# Patient Record
Sex: Male | Born: 1955 | Race: Black or African American | Hispanic: No | Marital: Single | Smoking: Current every day smoker
Health system: Southern US, Community
[De-identification: ages and names within clinical notes are randomized; demographics above are authoritative.]

## PROBLEM LIST (undated history)

## (undated) DIAGNOSIS — M1990 Unspecified osteoarthritis, unspecified site: Secondary | ICD-10-CM

## (undated) DIAGNOSIS — G8929 Other chronic pain: Principal | ICD-10-CM

## (undated) DIAGNOSIS — M545 Low back pain: Principal | ICD-10-CM

## (undated) DIAGNOSIS — T07XXXA Unspecified multiple injuries, initial encounter: Secondary | ICD-10-CM

## (undated) HISTORY — DX: Unspecified osteoarthritis, unspecified site: M19.90

## (undated) HISTORY — DX: Other chronic pain: G89.29

## (undated) HISTORY — DX: Low back pain: M54.5

## (undated) HISTORY — DX: Unspecified multiple injuries, initial encounter: T07.XXXA

---

## 1988-05-28 HISTORY — PX: OTHER SURGICAL HISTORY: SHX169

## 1994-05-28 DIAGNOSIS — M545 Low back pain, unspecified: Secondary | ICD-10-CM

## 1994-05-28 HISTORY — DX: Low back pain, unspecified: M54.50

## 1994-05-28 HISTORY — PX: LEG SURGERY: SHX1003

## 2007-11-02 ENCOUNTER — Emergency Department (HOSPITAL_COMMUNITY): Admission: EM | Admit: 2007-11-02 | Discharge: 2007-11-03 | Payer: Self-pay | Admitting: Emergency Medicine

## 2009-06-17 ENCOUNTER — Ambulatory Visit: Payer: Self-pay | Admitting: Diagnostic Radiology

## 2010-01-06 ENCOUNTER — Emergency Department (HOSPITAL_BASED_OUTPATIENT_CLINIC_OR_DEPARTMENT_OTHER): Admission: EM | Admit: 2010-01-06 | Discharge: 2010-01-06 | Payer: Self-pay | Admitting: Emergency Medicine

## 2010-04-09 ENCOUNTER — Emergency Department (HOSPITAL_COMMUNITY): Admission: EM | Admit: 2010-04-09 | Discharge: 2010-04-10 | Payer: Self-pay | Admitting: Pediatrics

## 2010-04-20 ENCOUNTER — Inpatient Hospital Stay (HOSPITAL_COMMUNITY)
Admission: EM | Admit: 2010-04-20 | Discharge: 2010-04-22 | Payer: Self-pay | Source: Home / Self Care | Admitting: Emergency Medicine

## 2010-04-21 ENCOUNTER — Ambulatory Visit: Payer: Self-pay | Admitting: Gastroenterology

## 2010-04-21 ENCOUNTER — Encounter (INDEPENDENT_AMBULATORY_CARE_PROVIDER_SITE_OTHER): Payer: Self-pay | Admitting: *Deleted

## 2010-04-22 ENCOUNTER — Encounter (INDEPENDENT_AMBULATORY_CARE_PROVIDER_SITE_OTHER): Payer: Self-pay | Admitting: *Deleted

## 2010-04-22 ENCOUNTER — Encounter (INDEPENDENT_AMBULATORY_CARE_PROVIDER_SITE_OTHER): Payer: Self-pay | Admitting: Internal Medicine

## 2010-04-23 ENCOUNTER — Encounter (INDEPENDENT_AMBULATORY_CARE_PROVIDER_SITE_OTHER): Payer: Self-pay | Admitting: *Deleted

## 2010-04-24 ENCOUNTER — Encounter (INDEPENDENT_AMBULATORY_CARE_PROVIDER_SITE_OTHER): Payer: Self-pay | Admitting: *Deleted

## 2010-05-04 ENCOUNTER — Emergency Department (HOSPITAL_BASED_OUTPATIENT_CLINIC_OR_DEPARTMENT_OTHER): Admission: EM | Admit: 2010-05-04 | Discharge: 2009-06-17 | Payer: Self-pay | Admitting: Emergency Medicine

## 2010-05-10 ENCOUNTER — Telehealth (INDEPENDENT_AMBULATORY_CARE_PROVIDER_SITE_OTHER): Payer: Self-pay | Admitting: *Deleted

## 2010-05-11 ENCOUNTER — Telehealth (INDEPENDENT_AMBULATORY_CARE_PROVIDER_SITE_OTHER): Payer: Self-pay | Admitting: *Deleted

## 2010-05-19 ENCOUNTER — Ambulatory Visit: Payer: Self-pay | Admitting: Gastroenterology

## 2010-06-01 ENCOUNTER — Telehealth (INDEPENDENT_AMBULATORY_CARE_PROVIDER_SITE_OTHER): Payer: Self-pay | Admitting: *Deleted

## 2010-06-01 ENCOUNTER — Encounter (INDEPENDENT_AMBULATORY_CARE_PROVIDER_SITE_OTHER): Payer: Self-pay | Admitting: *Deleted

## 2010-06-16 ENCOUNTER — Ambulatory Visit: Admit: 2010-06-16 | Payer: Self-pay | Admitting: Gastroenterology

## 2010-06-20 ENCOUNTER — Emergency Department (HOSPITAL_COMMUNITY)
Admission: EM | Admit: 2010-06-20 | Discharge: 2010-06-21 | Payer: Self-pay | Source: Home / Self Care | Admitting: Emergency Medicine

## 2010-06-21 LAB — DIFFERENTIAL
Lymphocytes Relative: 27 % (ref 12–46)
Lymphs Abs: 1.4 10*3/uL (ref 0.7–4.0)
Monocytes Relative: 5 % (ref 3–12)
Neutro Abs: 3.4 10*3/uL (ref 1.7–7.7)
Neutrophils Relative %: 66 % (ref 43–77)

## 2010-06-21 LAB — CBC
HCT: 31.1 % — ABNORMAL LOW (ref 39.0–52.0)
Hemoglobin: 10 g/dL — ABNORMAL LOW (ref 13.0–17.0)
MCH: 23 pg — ABNORMAL LOW (ref 26.0–34.0)
MCV: 71.7 fL — ABNORMAL LOW (ref 78.0–100.0)
RBC: 4.34 MIL/uL (ref 4.22–5.81)

## 2010-06-21 LAB — COMPREHENSIVE METABOLIC PANEL
Albumin: 3.8 g/dL (ref 3.5–5.2)
Alkaline Phosphatase: 62 U/L (ref 39–117)
BUN: 7 mg/dL (ref 6–23)
CO2: 25 mEq/L (ref 19–32)
Chloride: 111 mEq/L (ref 96–112)
Potassium: 4.1 mEq/L (ref 3.5–5.1)
Total Bilirubin: 0.4 mg/dL (ref 0.3–1.2)

## 2010-06-21 LAB — ETHANOL: Alcohol, Ethyl (B): 377 mg/dL — ABNORMAL HIGH (ref 0–10)

## 2010-06-22 ENCOUNTER — Emergency Department (HOSPITAL_COMMUNITY)
Admission: EM | Admit: 2010-06-22 | Discharge: 2010-06-23 | Payer: Self-pay | Source: Home / Self Care | Admitting: Emergency Medicine

## 2010-06-22 LAB — DIFFERENTIAL
Basophils Absolute: 0.1 10*3/uL (ref 0.0–0.1)
Eosinophils Absolute: 0.2 10*3/uL (ref 0.0–0.7)
Lymphocytes Relative: 22 % (ref 12–46)
Neutrophils Relative %: 69 % (ref 43–77)

## 2010-06-22 LAB — COMPREHENSIVE METABOLIC PANEL
Albumin: 3.6 g/dL (ref 3.5–5.2)
BUN: 5 mg/dL — ABNORMAL LOW (ref 6–23)
CO2: 25 mEq/L (ref 19–32)
Chloride: 109 mEq/L (ref 96–112)
Creatinine, Ser: 0.87 mg/dL (ref 0.4–1.5)
GFR calc non Af Amer: >60 mL/min (ref >60)
Total Bilirubin: 0.8 mg/dL (ref 0.3–1.2)

## 2010-06-22 LAB — ETHANOL: Alcohol, Ethyl (B): 276 mg/dL — ABNORMAL HIGH (ref 0–10)

## 2010-06-22 LAB — CBC
MCH: 22.7 pg — ABNORMAL LOW (ref 26.0–34.0)
MCHC: 31.9 g/dL (ref 30.0–36.0)
WBC: 5.8 10*3/uL (ref 4.0–10.5)

## 2010-06-27 NOTE — Letter (Signed)
Summary: New Patient letter  [facility]  [address] [place], [place] [address]   Phone: [phone]  Fax: [phone]       04/24/2010 MRN: [id number]  John Reid [address] [place], [place]  [address]  Dear John Reid,  Welcome to the Gastroenterology Division at [facility].    You are scheduled to see Dr.  Christella Reid on 05-19-10 at 3:00p.m. on the 3rd floor at [facility], [address]  We ask that you try to arrive at our office 15 minutes prior to your appointment time to allow for check-in.  We would like you to complete the enclosed self-administered evaluation form prior to your visit and bring it with you on the day of your appointment.  We will review it with you.  Also, please bring a complete list of all your medications or, if you prefer, bring the medication bottles and we will list them.  Please bring your insurance card so that we may make a copy of it.  If your insurance requires a referral to see a specialist, please bring your referral form from your primary care physician.  Co-payments are due at the time of your visit and may be paid by cash, check or credit card.     Your office visit will consist of a consult with your physician (includes a physical exam), any laboratory testing he/she may order, scheduling of any necessary diagnostic testing (e.g. x-ray, ultrasound, CT-scan), and scheduling of a procedure (e.g. Endoscopy, Colonoscopy) if required.  Please allow enough time on your schedule to allow for any/all of these possibilities.    If you cannot keep your appointment, please call [phone] to cancel or reschedule prior to your appointment date.  This allows [place] the opportunity to schedule an appointment for another patient in need of care.  If you do not cancel or reschedule by 5 p.m. the business day prior to your appointment date, you will be charged a $50.00 late cancellation/no-show fee.    Thank you for choosing  [facility] for your medical needs.  We appreciate the opportunity to care for you.  Please visit [place] at our website  to learn more about our practice.                     Sincerely,                                                             The Gastroenterology Division

## 2010-06-29 NOTE — Letter (Signed)
Summary: New Patient letter  [facility]  [address] [place], [place] [address]   Phone: [phone]  Fax: [phone]       06/01/2010 MRN: [id number]  John Reid [address] [place], [place]  [address]  Dear Mr. RHUE,  Welcome to the Gastroenterology Division at [facility].    You are scheduled to see Dr. Christella Hartigan  on 06/16/10  at 2:45 pm on the 3rd floor at [facility], [address].  We ask that you try to arrive at our office 15 minutes prior to your appointment time to allow for check-in.  We would like you to complete the enclosed self-administered evaluation form prior to your visit and bring it with you on the day of your appointment.  We will review it with you.  Also, please bring a complete list of all your medications or, if you prefer, bring the medication bottles and we will list them.  Please bring your insurance card so that we may make a copy of it.  If your insurance requires a referral to see a specialist, please bring your referral form from your primary care physician.  Co-payments are due at the time of your visit and may be paid by cash, check or credit card.     Your office visit will consist of a consult with your physician (includes a physical exam), any laboratory testing he/she may order, scheduling of any necessary diagnostic testing (e.g. x-ray, ultrasound, CT-scan), and scheduling of a procedure (e.g. Endoscopy, Colonoscopy) if required.  Please allow enough time on your schedule to allow for any/all of these possibilities.    If you cannot keep your appointment, please call [phone] to cancel or reschedule prior to your appointment date.  This allows [place] the opportunity to schedule an appointment for another patient in need of care.  If you do not cancel or reschedule by 5 p.m. the business day prior to your appointment date, you will be charged a $50.00 late cancellation/no-show fee.    Thank you for choosing  [facility] for your medical needs.  We appreciate the opportunity to care for you.  Please visit [place] at our website  to learn more about our practice.                     Sincerely,                                                             The Gastroenterology Division

## 2010-06-29 NOTE — Progress Notes (Signed)
Summary: cx appt  Phone Note Outgoing Call   Call placed by: Chales Abrahams CMA Duncan Dull),  May 11, 2010 9:17 AM Summary of Call: A phone number for the pt was found [phone].  I advised him that his appt would be cx because he has a ecent history with Dr Loreta Ave.  I also advised that he could have his records sent to Dr Christella Hartigan for review if he wishes to transfer care.  Pt verbalized understanding Initial call taken by: Chales Abrahams CMA Duncan Dull),  May 11, 2010 9:20 AM

## 2010-06-29 NOTE — Op Note (Signed)
Summary: Upper Endoscopy  [facility] [address] [place], [place] [address] OPERATIVE PROCEDURE REPORT PATIENT: John Reid, John Reid MR#: [id number] BIRTHDATE: 1956/04/21 GENDER: male ENDOSCOPIST: Lorenza Burton, MD ASSISTANT: Ricki Miller, technician and Altamese Dilling, RN. PROCEDURE DATE: 04/22/2010 PRE-PROCEDURE PREPERATION: Patient fasted for 8 hours prior to the procedure. PRE-PROCEDURE PHYSICAL: Patient has stable vital signs. Neck is supple. There is no JVD, thyromegaly or LAD. Chest clear to auscultation. S1 and S2 regular. Abdomen soft, non-distended, non-tender with NABS. PROCEDURE: EGD with multiple cold biopsies. ASA CLASS: Class II INDICATIONS: 1) Iron deficiency anemia 2) Rectal bleeding 3) Alcoholism. MEDICATIONS: Fentanyl 75 mcg, Benadryl 25 mg & Versed 5 mg IV. TOPICAL ANESTHETIC: Viscus xylocaine-10 cc PO. DESCRIPTION OF PROCEDURE: After the risks benefits and alternatives of the procedure were thoroughly explained, informed consent was obtained. The EG-[id number] ([id number]) and EC-3890Li ([id number]) endoscope was introduced through the mouth and advanced to the second portion of the duodenum, without limitations. The instrument was slowly withdrawn as the mucosa was fully examined. The esophagus and GEJ were widely patent with a small patch of salmon colored mucosa above the Z-line that was biopsied to rule out Barrett's. There were no varices or evidence of esophagitis. The scope was then advanced into the stomach. The patient had large gastric folds with nodular changes in the gastric mucosa in the midbody of the stomach-multiple cold biopsies were done. The proximal small bowel appeared normal. There were no ulcers, erosions, masses or polyps noted. Retroflexed views revealed a small hiatal hernia. The scope was then withdrawn from the patient and the procedure terminated. The patient tolerated the procedure without immediate complications. Moses Rexene Edison [facility] [address] [place], [place] [address] IMPRESSION: 1) Small patch of ?Barrett's mucosa biopsied above the Z-line. 2) Hypertrophic gasrtric folds-biopsied. 3) Small hiatal hernia. 4) Normal proximal small bowel. RECOMMENDATIONS: 1) Anti-reflux regimen to be followed. 2) Await pathology results. 3) Avoid NSAIDS for now. 4) Continue PPI'S. 5) Proceed with a colonoscopy. REPEAT EXAM: None planned for now. DISCHARGE INSTRUCTIONS: Standard instructions given. _______________________________ Lorenza Burton, MD CPT CODES: 64403 DIAGNOSIS CODES: 280.9, 569.3 CC: Rob Bunting, M.D. n. eSIGNED: Lorenza Burton at 04/22/2010 10:33 AM Page 2

## 2010-06-29 NOTE — Op Note (Signed)
Summary: Colonoscopy  [facility] [address] [place], [place] [address] OPERATIVE PROCEDURE REPORT PATIENT: John Reid, John Reid MR#: [id number] BIRTHDATE: 01-21-1956 GENDER: male ENDOSCOPIST: Lorenza Burton, MD ASSISTANT: Ricki Miller, technician and Altamese Dilling, RN. PROCEDURE DATE: 04/22/2010 PRE-PROCEDURE PREPERATION: The patient was prepped with 2 dulcolax tablets, one ten-ounce bottle of magnesium citrate, and a gallon of Golytley the night prior to the procedure. The patient was fasted for 8 hours prior to the procedure. PRE-PROCEDURE PHYSICAL: Patient has stable vital signs. Neck is supple. There is no JVD, thyromegaly or LAD. Chest clear to auscultation. S1 and S2 regular. Abdomen soft, non-distended, non-tender with NABS. PROCEDURE: Colonoscopy with cold biopsies x 2. ASA CLASS: Class II INDICATIONS: 1) Colorectal cancer screening 2) Iron deficiency anemia 3) Rectal bleeding. MEDICATIONS: Fentanyl 25 mcg & Versed 5 mg IV. DESCRIPTION OF PROCEDURE: After the risks, benefits, and alternatives of the procedure were thoroughly explained [including a 10% missed rate of cancer and polyps], informed consent was obtained. Digital rectal exam was performed. The EC-3890Li ([id number]) colonoscope was introduced through the anus and advanced to the terminal ileum which was intubated for a short distance, without limitations. The quality of the prep was adequate. Multiple washes were done. Small lesions could be missed. The instrument was then slowly withdrawn as the colon was fully examined. FINDINGS: There was one small sessile cecal polyp that was removed by cold biopsies x 2. The rest of the colonic mucosa appeared healthy with a normal vascular pattern. No masses, diverticula or AVM's were noted. The appendiceal orifice and the ICV were identified and photographed. The terminal ileum appeared normal. Retroflexed views revealed prominent internal hemorrhoids. The  patient tolerated the procedure without immediate complications. The scope was then withdrawn from the patient and the procedure terminated. [name], [name] IMPRESSION: 1) Prominent internal hemorrhoids. 2) Small sessile cecal polyp removed by cold biopsies x 2. 3) Otherwise normal colonoscopy up to the terminal ileum. RECOMMENDATIONS: 1) High fiber diet with liberal fluid intake. 2) Await pathology results. 3) Avoid all NSAIDS for now. 4) Serials CBC's. 5) Iron supplementation as needed. REPEAT EXAM: In 5-10 years, depending on the pathology results; in case the patient has any abnormal GI symptoms in the interim, he should contact the office immediately for further recommendations. DISCHARGE INSTRUCTIONS: Standard discharge instructions given. _______________________________ Lorenza Burton, MD CPT CODES: 25366 DIAGNOSIS CODES: 211.3, 569.3, 280.9, V76.51 CC: Rob Bunting, M.D. n. eSIGNED: Lorenza Burton at 04/22/2010 10:47 AM Page 2

## 2010-06-29 NOTE — Progress Notes (Signed)
Summary: Appt question  Phone Note Other Incoming   Caller: Lupita Leash Summary of Call: DrJacobs this pt was on your schedule and was cx because of a history with Dr Loreta Ave.  Lupita Leash from Dr Kenna Gilbert office called yesterday and advised me that Dr Loreta Ave only saw the pt in the hospital unassigned call and you discharged the pt and should be your pt.  Can I put him back on your schedule? Initial call taken by: Chales Abrahams CMA Duncan Dull),  June 01, 2010 8:26 AM  Follow-up for Phone Call        yes, I just spoke with Dr. Loreta Ave actually.  She was only covering Leb in hospital.  He can stay on my schedule.  Also, he needs prev-pac called in because of H. pylori + biospies of stomach by Dr. Loreta Ave. Follow-up by: Rachael Fee MD,  June 01, 2010 8:29 AM  Additional Follow-up for Phone Call Additional follow up Details #1::        pt aware and rx was sent in. NP3 was scheduled Additional Follow-up by: Chales Abrahams CMA Duncan Dull),  June 01, 2010 8:38 AM    New/Updated Medications: PREVPAC  MISC (AMOXICILL-CLARITHRO-LANSOPRAZ) as directed Prescriptions: PREVPAC  MISC (AMOXICILL-CLARITHRO-LANSOPRAZ) as directed  #1 x 0   Entered by:   Chales Abrahams CMA (AAMA)   Authorized by:   Rachael Fee MD   Signed by:   Chales Abrahams CMA (AAMA) on 06/01/2010   Method used:   Electronically to        [facility] [phone]* (retail)       [address]       [place], [place]  [address]       Ph: [phone]       Fax: [phone]   RxID:   [id number]

## 2010-06-29 NOTE — Discharge Summary (Signed)
Summary: Rectal bleeding    NAME:  John Reid, John Reid               ACCOUNT NO.:  [id number]      MEDICAL RECORD NO.:  [id number]          PATIENT TYPE:  INP      LOCATION:  3733                         FACILITY:  [facility]      PHYSICIAN:  Zannie Cove, MD     DATE OF BIRTH:  11/09/1955      DATE OF ADMISSION:  04/20/2010   DATE OF DISCHARGE:  04/22/2010                                  DISCHARGE SUMMARY         PRIMARY CARE PHYSICIAN:  None.  The patient is unassigned.      DISCHARGE DIAGNOSES:   1. Rectal bleeding, most likely hemorrhoidal, resolved.   2. Iron-deficiency anemia.   3. Cecal polyp status post polypectomy.   4. A small patch of questionable Barrett mucosa status post biopsy,       results pending.   5. Small hiatal hernia.   6. Alcohol abuse.      DISCHARGE MEDICATIONS:   1. Tylenol 650 mg q.6 h p.r.n.   2. Anusol 2.5% 1/2 inch apply to affected area b.i.d. p.r.n.   3. Colace 100 mg p.o. b.i.d. p.r.n. constipation.   4. Ferrous sulfate 325 mg 1 tablet p.o. b.i.d.   5. Folic acid 1 mg p.o. daily.   6. Protonix 40 mg p.o. b.i.d. for 1 month and then once a day.   7. Thiamine 100 mg p.o. daily.      CONSULTANTS:  Dr. Rob Bunting, [facility].      DIAGNOSTICS/INVESTIGATIONS:   1. CT of abdomen and pelvis, November 25; no acute abnormalities,       rectum and anal cecal canal are grossly unremarkable.   2. Chest x-ray on November 25; no acute abnormalities.   3. EGD by Dr. Charna Elizabeth covering for Dr. Christella Hartigan, November 26, showed       small patch of questionable Barrett mucosa, biopsied above Z-line.       There was hypertrophic gastric folds which were biopsied, small       hiatal hernia, and normal proximal small bowel.   4. Colonoscopy by Dr. Loreta Ave, November 26 showed that there were       prominent internal hemorrhoids and small sessile cecal polyp       removed by cold biopsy x2, otherwise normal colonoscopy up to the       terminal ileum.      HOSPITAL  COURSE:  [name] is a 55 year old gentleman with history of   alcohol abuse presented to the hospital with bright red blood per   rectum.  Upon evaluation, he was found to have severe iron-deficiency   anemia with hemoglobin of 8.7, baseline hemoglobin was in the mid 9   range than usual for 55 year old.  Hence, he was admitted to the   hospital, underwent EGD and colonoscopy with results as dictated above.   He is status post gastric mucosal as well as esophageal possible Barrett   mucosal biopsy, results are pending at this point.  However, the patient   is  clinically stable, improved.  No further episodes of bleeding.  We   suspect his rectal bleeding was most likely hemorrhoidal in nature.  He   will follow up with Dr. Christella Hartigan in 2-3 weeks to follow up on his biopsy   results.      Alcohol abuse, counseled.      DISCHARGE CONDITION:  Stable.      Vital Signs: Temperature is 98.6, pulse 53, blood pressure 143/81,   respirations 18, and saturating 95% on room air.      DISCHARGE LABORATORY DATA:  White count 3.2, hemoglobin 9.1, and   platelets 104.      DISCHARGE FOLLOWUP:  Dr. Rob Bunting in 3-4 weeks to follow up on the   biopsy results.               Zannie Cove, MD               PJ/MEDQ  D:  04/22/2010  T:  04/23/2010  Job:  [id number]      cc:   Rachael Fee, MD      Electronically Signed by Zannie Cove  on 04/25/2010 05:47:27 PM

## 2010-06-29 NOTE — Progress Notes (Signed)
Summary: CX appt  Phone Note Outgoing Call Call back at [facility] Phone [phone]   Call placed by: Chales Abrahams CMA Duncan Dull),  May 10, 2010 3:05 PM Summary of Call: call placed to pt to cx appt with Dr Christella Hartigan on 05/19/10.  He saw Charna Elizabeth and needs to f/u with her. unable to leave message no answering  machine Initial call taken by: Chales Abrahams CMA Duncan Dull),  May 10, 2010 3:06 PM  Follow-up for Phone Call        The phone number given was incorrect, the person taking the call states the pt no longer lives there and has no number for him.  Address is also in correct Follow-up by: Chales Abrahams CMA Duncan Dull),  May 11, 2010 9:10 AM

## 2010-06-29 NOTE — Procedures (Signed)
Summary: Colon path  SP-Surgical Pathology - STATUS: Final  .                                         Perform Date: [id number] 00:01  Ordered By: Joanna Hews                Ordered Date:  Facility: [facility]                              Department: CPATH  Service Report Text  [facility]   [address]   [place] , [place] [address]   Telephone : [phone] Fax : [phone]    REPORT OF SURGICAL PATHOLOGY    Accession #: [id number]   Patient Name: John Reid, John Reid   Visit # : [id number]    MRN: [id number]   Physician: Charna Elizabeth   DOB/Age [birth year] (Age: 55) Gender: M   Collected Date: 04/22/2010   Received Date: 04/24/2010    FINAL DIAGNOSIS    1. 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    2. Esophagus, biopsy, :   BENIGN, MILDLY INFLAMED GASTROESOPHAGEAL JUNCTION MUCOSA.NO INTESTINAL   METAPLASIA, DYSPLASIA   OR MALIGNANCY IDENTIFIED.   3. Colon, polyp(s), cecal :   TUBULAR ADENOMA.NO HIGH GRADE DYSPLASIA OR MALIGNANCY IDENTIFIED.    ELECTRONIC SIGNATURE : Jacelyn Grip, John, Pathologist, Electronic Signature    MICROSCOPIC DESCRIPTION   1. A Warthin-Starry stain is performed to determine the possibility of the   presence of Helicobacter pylori.Organisms of Helicobacter pylori are identified   on the Warthin-Starry stain.   2. An Alcian Blue stain is performed to determine the presence of intestinal   metaplasia (goblet cell metaplasia).No intestinal metaplasia (goblet cell   metaplasia) is identified with the Alcian Blue stain.JDP/eps 04/25/10    CASE COMMENTS   STAINS USED IN DIAGNOSIS:   Warthin-Starry Stain   Alcian Blue pH 2.5 Stain    CLINICAL HISTORY    SPECIMEN(S) OBTAINED   1. Stomach, biopsy, Prominent Folds   2. Esophagus, biopsy,   3. Colon, polyp(s), Cecal    SPECIMEN COMMENTS:   SPECIMEN CLINICAL INFORMATION:   1. R/O  Barrett's (ms)    Gross Description   1. Received in formalin are tan, soft tissue fragments that are submitted in   toto.Number: Multiple, Size: 1.2 x 1.2 x 0.2 cm, (1 B).   2. Received in formalin are tan, soft tissue fragments that are submitted in   toto.Number: One, Size: 0.4 cm, (1 B).   3. Received in formalin is a 0.5 cm tan, polypoid tissue which is submitted in   toto in one block.SW/eps 04/24/10    Report signed out from the following location(s)   [place]   [address], [place] [address] CLIA #: [id number]    [facility]   [address] [place], [place] [address] CLIA #: [id number]  Additional Information  [id number] RESULT STATUS : F  External IF Update Timestamp : 2010-04-25:15:17:00.000000

## 2010-07-26 ENCOUNTER — Emergency Department (HOSPITAL_COMMUNITY): Payer: Self-pay

## 2010-07-26 ENCOUNTER — Emergency Department (HOSPITAL_COMMUNITY)
Admission: EM | Admit: 2010-07-26 | Discharge: 2010-07-27 | Disposition: A | Discharge status: Home / Self Care | Payer: Self-pay | Attending: Emergency Medicine | Admitting: Emergency Medicine

## 2010-07-26 DIAGNOSIS — F1010 Alcohol abuse, uncomplicated: Secondary | ICD-10-CM | POA: Insufficient documentation

## 2010-07-26 DIAGNOSIS — IMO0002 Reserved for concepts with insufficient information to code with codable children: Secondary | ICD-10-CM | POA: Insufficient documentation

## 2010-07-26 DIAGNOSIS — S91309A Unspecified open wound, unspecified foot, initial encounter: Secondary | ICD-10-CM | POA: Insufficient documentation

## 2010-07-26 DIAGNOSIS — F1210 Cannabis abuse, uncomplicated: Secondary | ICD-10-CM | POA: Insufficient documentation

## 2010-08-02 ENCOUNTER — Ambulatory Visit: Payer: Self-pay | Admitting: Gastroenterology

## 2010-08-08 LAB — POCT I-STAT, CHEM 8
BUN: 5 mg/dL — ABNORMAL LOW (ref 6–23)
Creatinine, Ser: 1 mg/dL (ref 0.4–1.5)
Glucose, Bld: 105 mg/dL — ABNORMAL HIGH (ref 70–99)
Hemoglobin: 11.9 g/dL — ABNORMAL LOW (ref 13.0–17.0)
Sodium: 142 mEq/L (ref 135–145)
TCO2: 22 mmol/L (ref 0–100)

## 2010-08-08 LAB — HEMOCCULT GUIAC POC 1CARD (OFFICE): Fecal Occult Bld: POSITIVE

## 2010-08-08 LAB — DIFFERENTIAL
Basophils Relative: 0 % (ref 0–1)
Basophils Relative: 1 % (ref 0–1)
Eosinophils Absolute: 0.1 10*3/uL (ref 0.0–0.7)
Eosinophils Relative: 2 % (ref 0–5)
Eosinophils Relative: 3 % (ref 0–5)
Monocytes Absolute: 0.2 10*3/uL (ref 0.1–1.0)
Monocytes Absolute: 0.3 10*3/uL (ref 0.1–1.0)
Monocytes Relative: 4 % (ref 3–12)
Monocytes Relative: 6 % (ref 3–12)
Neutro Abs: 2.5 10*3/uL (ref 1.7–7.7)

## 2010-08-08 LAB — TSH: TSH: 2.793 u[IU]/mL (ref 0.350–4.500)

## 2010-08-08 LAB — URINALYSIS, ROUTINE W REFLEX MICROSCOPIC
Bilirubin Urine: NEGATIVE
Glucose, UA: NEGATIVE mg/dL
Ketones, ur: NEGATIVE mg/dL
Nitrite: NEGATIVE
Specific Gravity, Urine: 1.006 (ref 1.005–1.030)
pH: 5 (ref 5.0–8.0)

## 2010-08-08 LAB — FOLATE
Folate: 7.1 ng/mL
Folate: >20 ng/mL

## 2010-08-08 LAB — CBC
HCT: 27.7 % — ABNORMAL LOW (ref 39.0–52.0)
HCT: 28 % — ABNORMAL LOW (ref 39.0–52.0)
HCT: 28.8 % — ABNORMAL LOW (ref 39.0–52.0)
HCT: 31.9 % — ABNORMAL LOW (ref 39.0–52.0)
Hemoglobin: 8.6 g/dL — ABNORMAL LOW (ref 13.0–17.0)
Hemoglobin: 8.8 g/dL — ABNORMAL LOW (ref 13.0–17.0)
Hemoglobin: 8.8 g/dL — ABNORMAL LOW (ref 13.0–17.0)
Hemoglobin: 9 g/dL — ABNORMAL LOW (ref 13.0–17.0)
Hemoglobin: 9.1 g/dL — ABNORMAL LOW (ref 13.0–17.0)
Hemoglobin: 9.9 g/dL — ABNORMAL LOW (ref 13.0–17.0)
MCH: 23.2 pg — ABNORMAL LOW (ref 26.0–34.0)
MCH: 23.6 pg — ABNORMAL LOW (ref 26.0–34.0)
MCH: 23.9 pg — ABNORMAL LOW (ref 26.0–34.0)
MCH: 24.6 pg — ABNORMAL LOW (ref 26.0–34.0)
MCHC: 31 g/dL (ref 30.0–36.0)
MCV: 74.9 fL — ABNORMAL LOW (ref 78.0–100.0)
MCV: 76.1 fL — ABNORMAL LOW (ref 78.0–100.0)
Platelets: 94 10*3/uL — ABNORMAL LOW (ref 150–400)
RBC: 3.64 MIL/uL — ABNORMAL LOW (ref 4.22–5.81)
RBC: 3.68 MIL/uL — ABNORMAL LOW (ref 4.22–5.81)
RBC: 3.7 MIL/uL — ABNORMAL LOW (ref 4.22–5.81)
RBC: 3.88 MIL/uL — ABNORMAL LOW (ref 4.22–5.81)
RDW: 17.2 % — ABNORMAL HIGH (ref 11.5–15.5)
RDW: 17.3 % — ABNORMAL HIGH (ref 11.5–15.5)
RDW: 17.5 % — ABNORMAL HIGH (ref 11.5–15.5)
WBC: 2.8 10*3/uL — ABNORMAL LOW (ref 4.0–10.5)
WBC: 3.3 10*3/uL — ABNORMAL LOW (ref 4.0–10.5)
WBC: 3.9 10*3/uL — ABNORMAL LOW (ref 4.0–10.5)
WBC: 3.9 10*3/uL — ABNORMAL LOW (ref 4.0–10.5)
WBC: 5 10*3/uL (ref 4.0–10.5)

## 2010-08-08 LAB — COMPREHENSIVE METABOLIC PANEL
ALT: 42 U/L (ref 0–53)
AST: 58 U/L — ABNORMAL HIGH (ref 0–37)
Albumin: 3.2 g/dL — ABNORMAL LOW (ref 3.5–5.2)
Alkaline Phosphatase: 66 U/L (ref 39–117)
BUN: 4 mg/dL — ABNORMAL LOW (ref 6–23)
Calcium: 7.8 mg/dL — ABNORMAL LOW (ref 8.4–10.5)
Chloride: 105 mEq/L (ref 96–112)
Glucose, Bld: 83 mg/dL (ref 70–99)
Potassium: 3.5 mEq/L (ref 3.5–5.1)
Sodium: 135 mEq/L (ref 135–145)
Total Protein: 5.9 g/dL — ABNORMAL LOW (ref 6.0–8.3)
Total Protein: 6.2 g/dL (ref 6.0–8.3)

## 2010-08-08 LAB — CROSSMATCH
ABO/RH(D): O POS
Antibody Screen: NEGATIVE
Unit division: 0

## 2010-08-08 LAB — RAPID URINE DRUG SCREEN, HOSP PERFORMED
Amphetamines: NOT DETECTED
Benzodiazepines: NOT DETECTED

## 2010-08-08 LAB — HEMOGLOBIN AND HEMATOCRIT, BLOOD
HCT: 27.6 % — ABNORMAL LOW (ref 39.0–52.0)
Hemoglobin: 8.7 g/dL — ABNORMAL LOW (ref 13.0–17.0)

## 2010-08-08 LAB — URINE CULTURE
Colony Count: NO GROWTH
Culture  Setup Time: 201111251054

## 2010-08-08 LAB — IRON AND TIBC
Iron: <10 ug/dL — ABNORMAL LOW (ref 42–135)
Saturation Ratios: 3 % — ABNORMAL LOW (ref 20–55)
TIBC: 330 ug/dL (ref 215–435)
UIBC: 306 ug/dL

## 2010-08-08 LAB — GLUCOSE, CAPILLARY
Glucose-Capillary: 103 mg/dL — ABNORMAL HIGH (ref 70–99)
Glucose-Capillary: 85 mg/dL (ref 70–99)
Glucose-Capillary: 85 mg/dL (ref 70–99)
Glucose-Capillary: 86 mg/dL (ref 70–99)
Glucose-Capillary: 88 mg/dL (ref 70–99)

## 2010-08-08 LAB — ABO/RH: ABO/RH(D): O POS

## 2010-08-08 LAB — FERRITIN
Ferritin: 10 ng/mL — ABNORMAL LOW (ref 22–322)
Ferritin: 8 ng/mL — ABNORMAL LOW (ref 22–322)

## 2010-08-08 LAB — VITAMIN B12: Vitamin B-12: 524 pg/mL (ref 211–911)

## 2010-08-08 LAB — ETHANOL
Alcohol, Ethyl (B): 279 mg/dL — ABNORMAL HIGH (ref 0–10)
Alcohol, Ethyl (B): 392 mg/dL — ABNORMAL HIGH (ref 0–10)

## 2010-08-08 LAB — PROTIME-INR: INR: 1.1 (ref 0.00–1.49)

## 2010-08-14 LAB — POCT TOXICOLOGY PANEL

## 2010-08-14 LAB — DIFFERENTIAL
Basophils Relative: 1 % (ref 0–1)
Eosinophils Absolute: 0.1 10*3/uL (ref 0.0–0.7)
Eosinophils Relative: 2 % (ref 0–5)
Monocytes Absolute: 0.4 10*3/uL (ref 0.1–1.0)
Neutro Abs: 4.5 10*3/uL (ref 1.7–7.7)

## 2010-08-14 LAB — COMPREHENSIVE METABOLIC PANEL
AST: 38 U/L — ABNORMAL HIGH (ref 0–37)
Albumin: 4.2 g/dL (ref 3.5–5.2)
Alkaline Phosphatase: 70 U/L (ref 39–117)
Chloride: 107 mEq/L (ref 96–112)
GFR calc Af Amer: >60 mL/min (ref >60)
Potassium: 4.2 mEq/L (ref 3.5–5.1)
Sodium: 140 mEq/L (ref 135–145)
Total Bilirubin: 0.6 mg/dL (ref 0.3–1.2)
Total Protein: 7.5 g/dL (ref 6.0–8.3)

## 2010-08-14 LAB — ETHANOL: Alcohol, Ethyl (B): 375 mg/dL — ABNORMAL HIGH (ref 0–10)

## 2010-08-14 LAB — CBC
Platelets: 202 10*3/uL (ref 150–400)
RDW: 18.4 % — ABNORMAL HIGH (ref 11.5–15.5)
WBC: 7.2 10*3/uL (ref 4.0–10.5)

## 2010-08-14 LAB — POCT CARDIAC MARKERS: Troponin i, poc: <0.05 ng/mL (ref 0.00–0.09)

## 2011-02-24 IMAGING — CR DG CHEST 1V PORT
1 series · 1 of 1 positions shown · non-contrast
Comparison: 04/10/2010

CLINICAL DATA: The GI bleed.  Pain.

PORTABLE CHEST - 1 VIEW

[view not recorded]
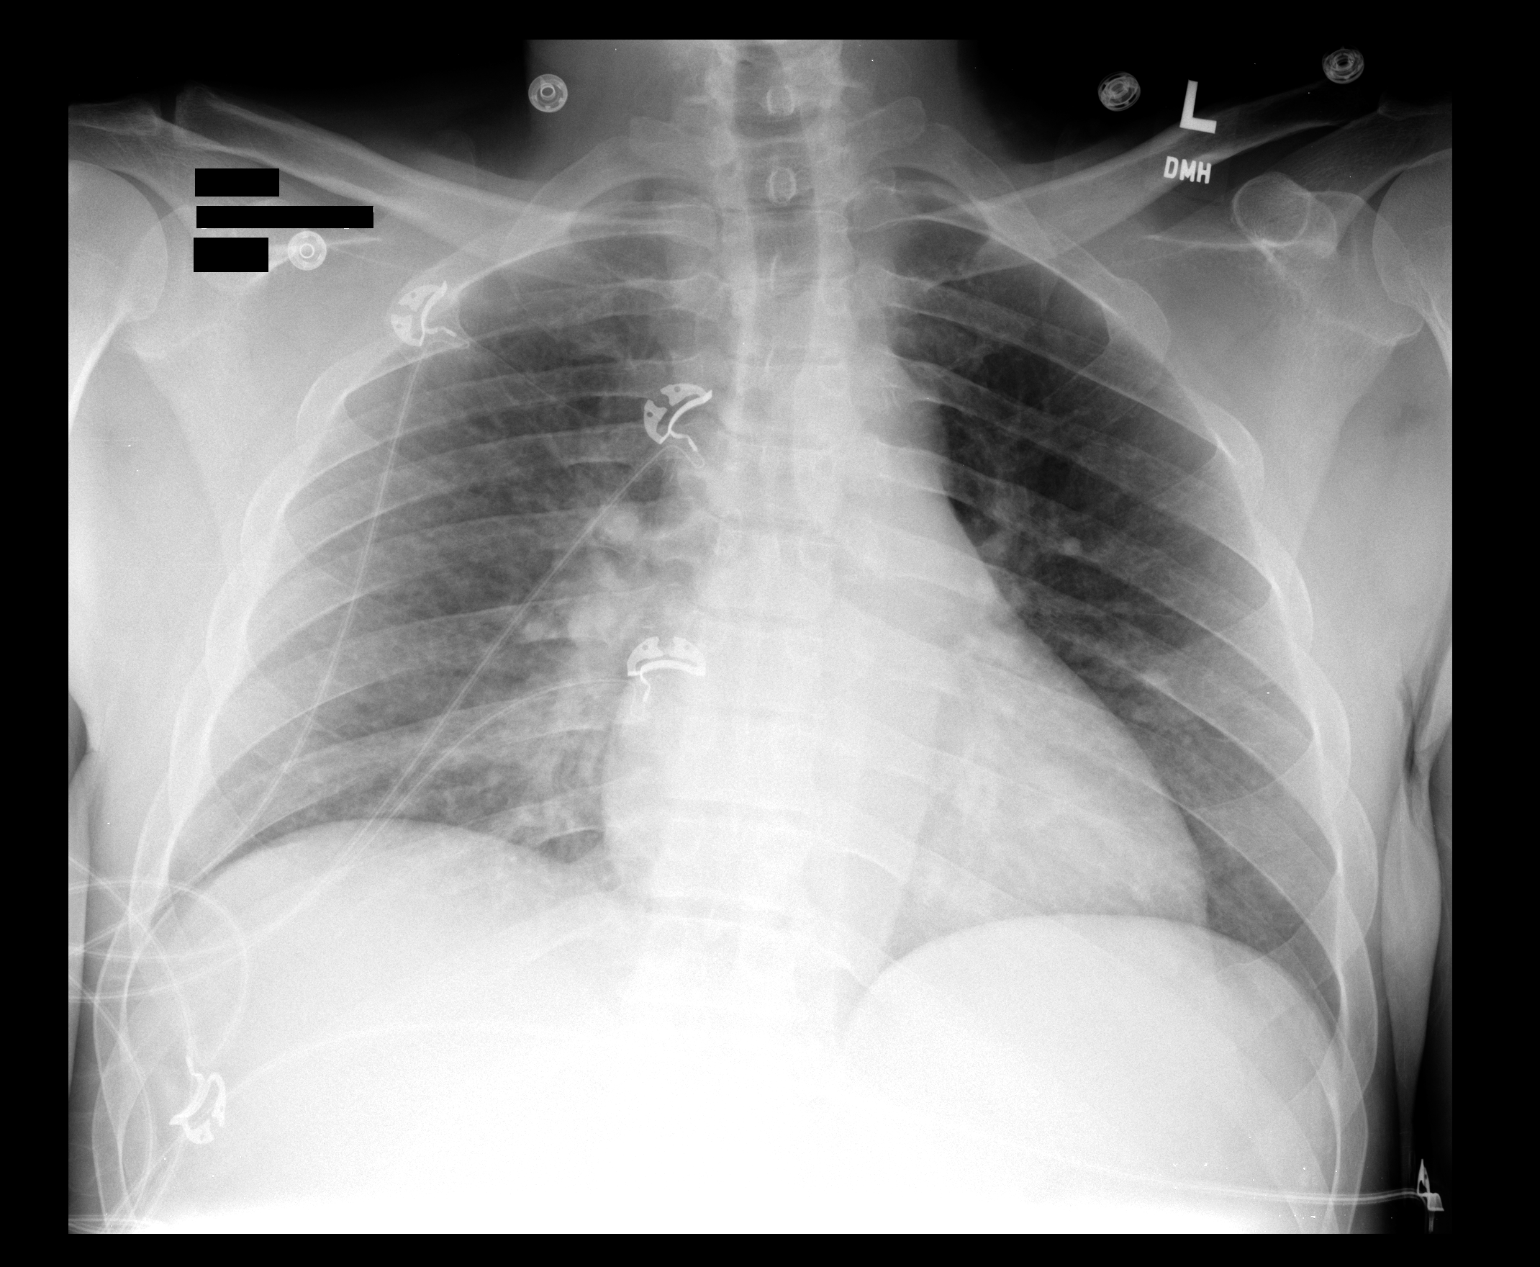

[1 of 1 positions shown; findings below may reference images not displayed]

FINDINGS: The heart size and vascularity are normal and the lungs
are clear.  Markings are slightly accentuated due to a shallow
inspiration.
IMPRESSION: No acute abnormalities.

## 2011-02-24 IMAGING — CT CT ABD-PELV W/ CM
1 of 3 series · 14 of 32 positions shown, 19 images · IV contrast (APPLIED)
Comparison: Abdominal radiograph performed 06/17/2009

CLINICAL DATA: Rectal bleeding after bowel movements; recently
diagnosed with hemorrhoids.  Anemia.

CT ABDOMEN AND PELVIS WITH CONTRAST
TECHNIQUE: Multidetector CT imaging of the abdomen and pelvis was
performed following the standard protocol during bolus
administration of intravenous contrast.
Contrast: 100 mL of Omnipaque 300 IV contrast

[Series 2: abd/pelv with 5.0 b31f st · axial · 0.76mm/px · z∈[-451,-61]mm · 14 of 88 slices shown, 19 images]
[im 5/88  soft-tissue]
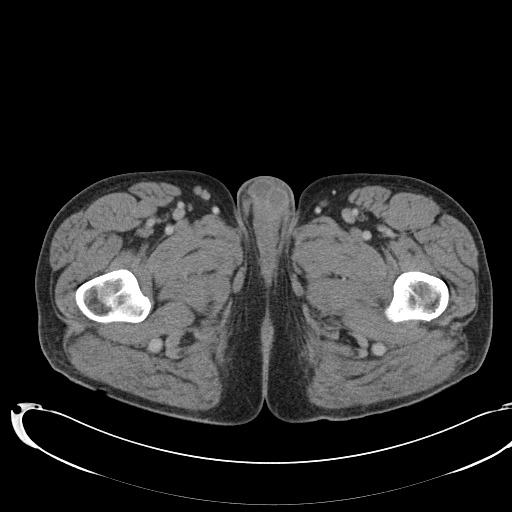
[im 5/88  bone]
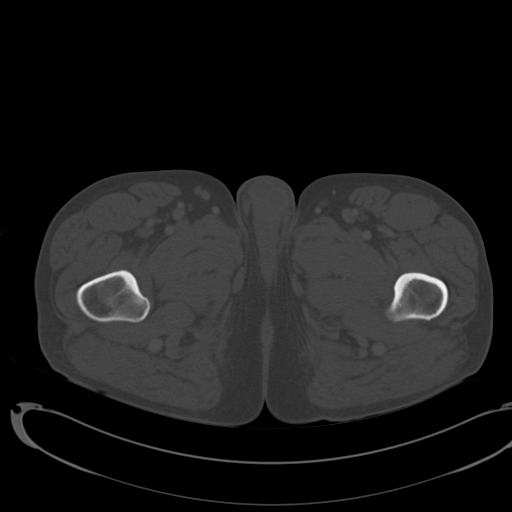
[im 14/88  soft-tissue]
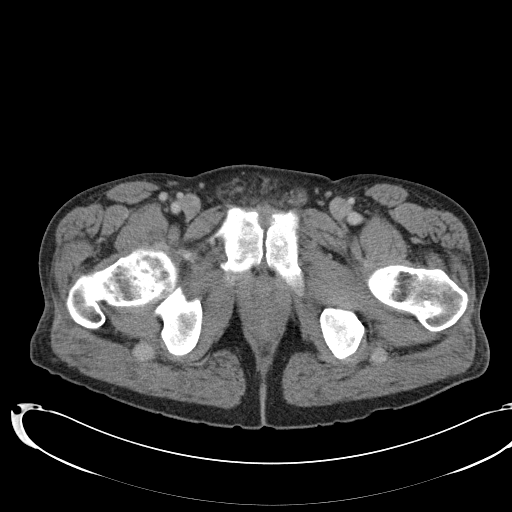
[im 19/88  soft-tissue]
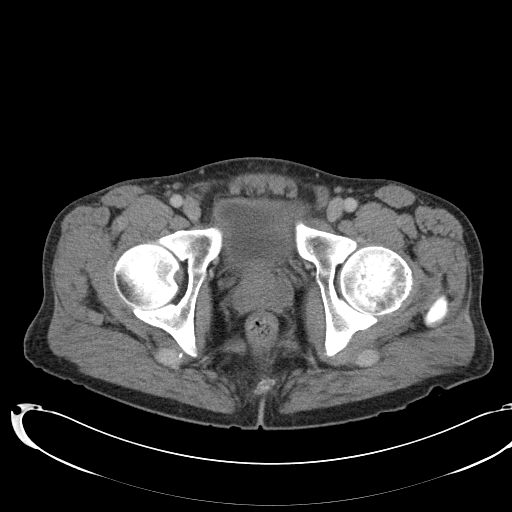
[im 23/88  soft-tissue]
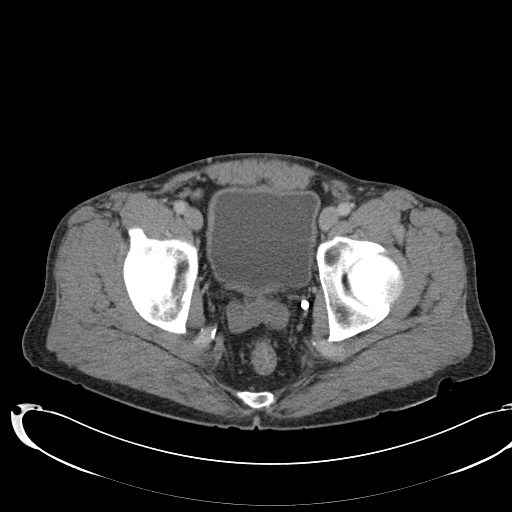
[im 33/88  soft-tissue]
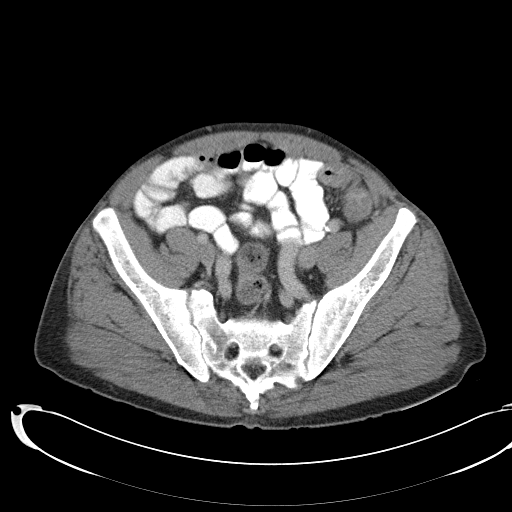
[im 37/88  soft-tissue]
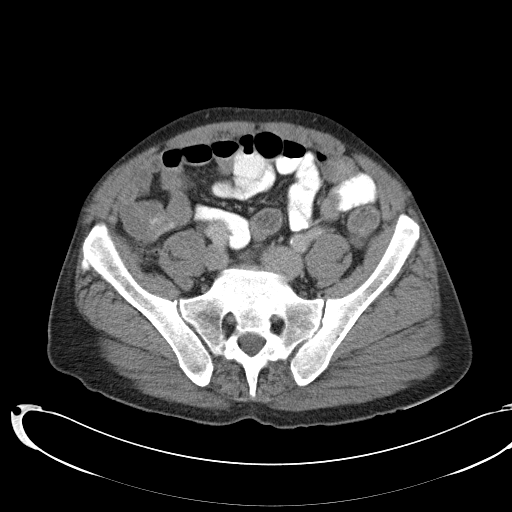
[im 46/88  soft-tissue]
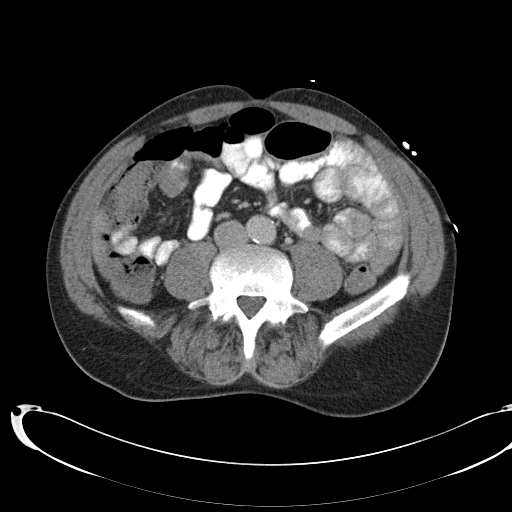
[im 51/88  soft-tissue]
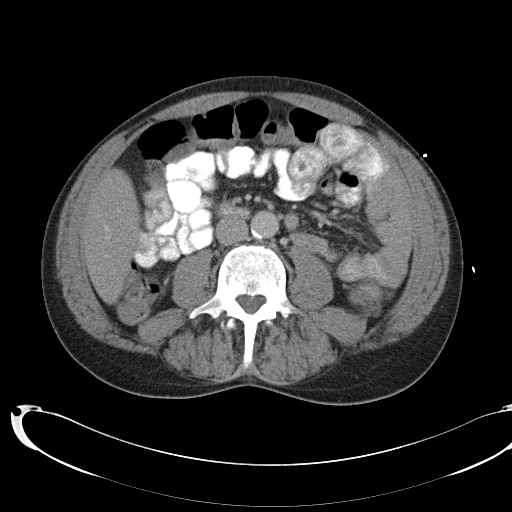
[im 55/88  soft-tissue]
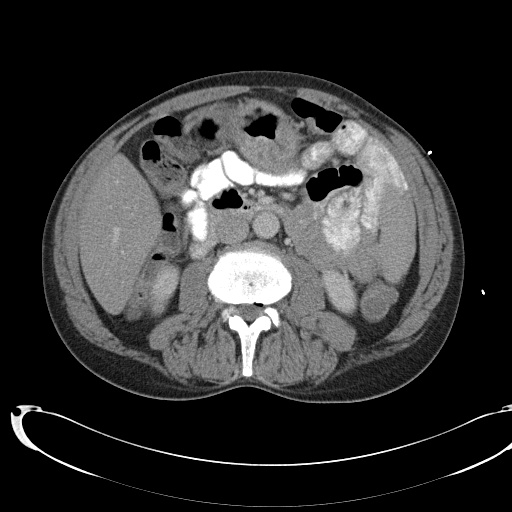
[im 55/88  bone]
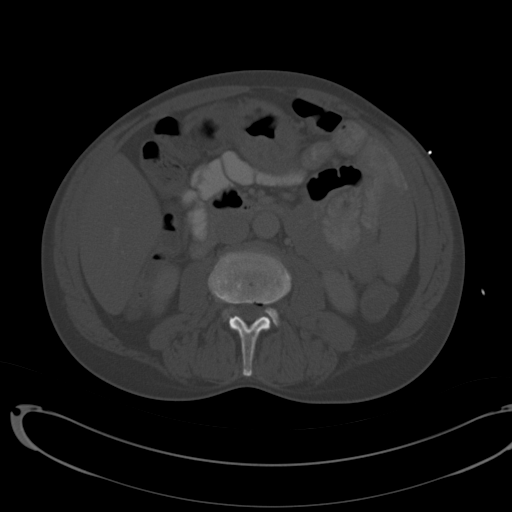
[im 65/88  soft-tissue]
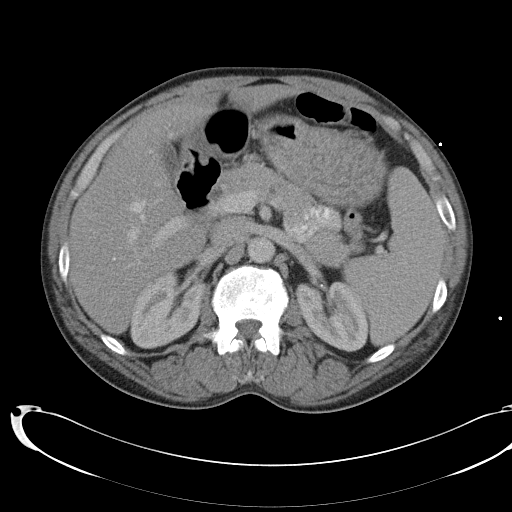
[im 69/88  soft-tissue]
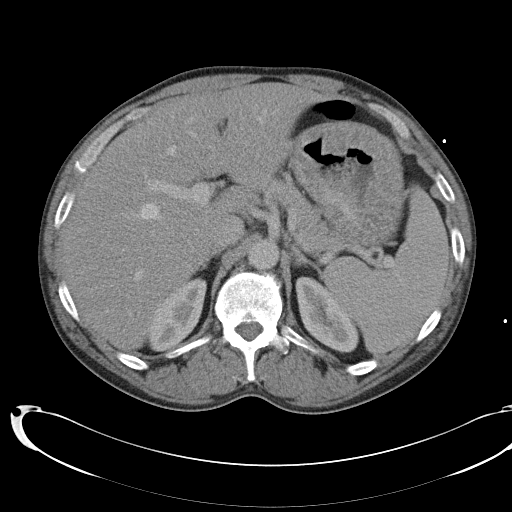
[im 69/88  lung]
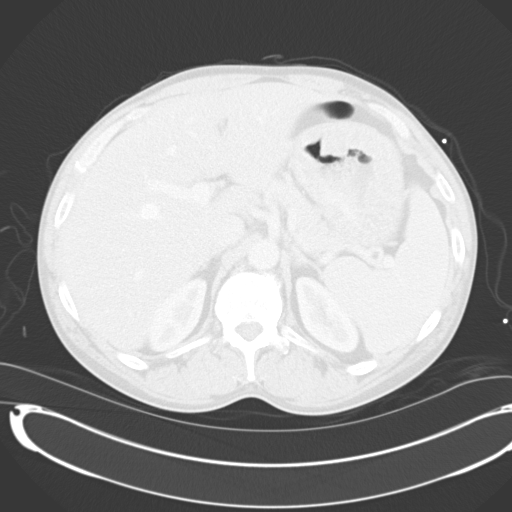
[im 74/88  soft-tissue]
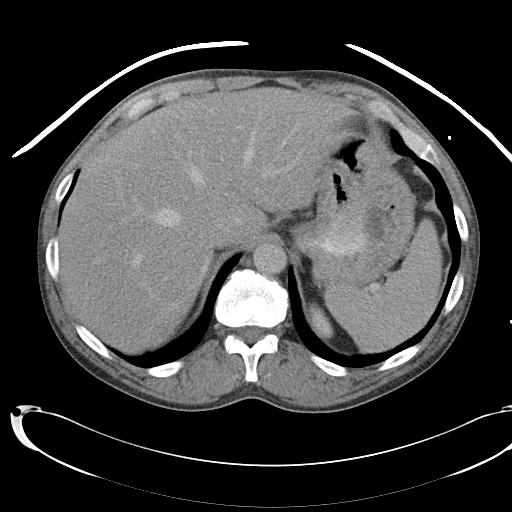
[im 74/88  lung]
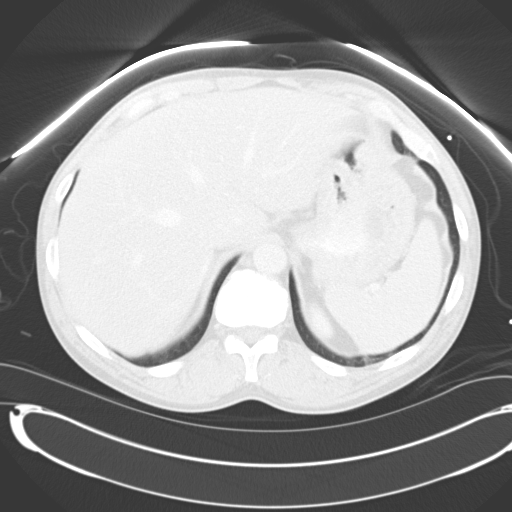
[im 78/88  lung]
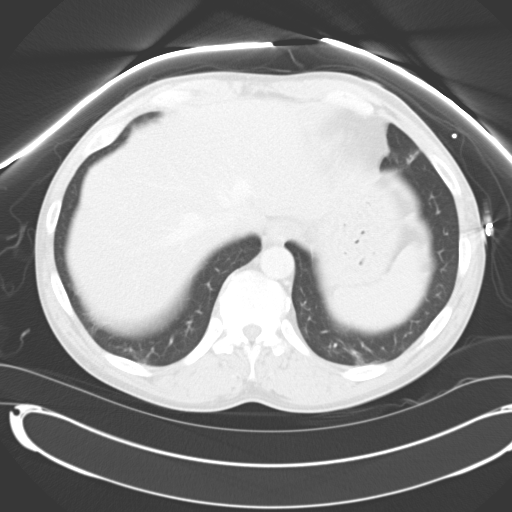
[im 83/88  soft-tissue]
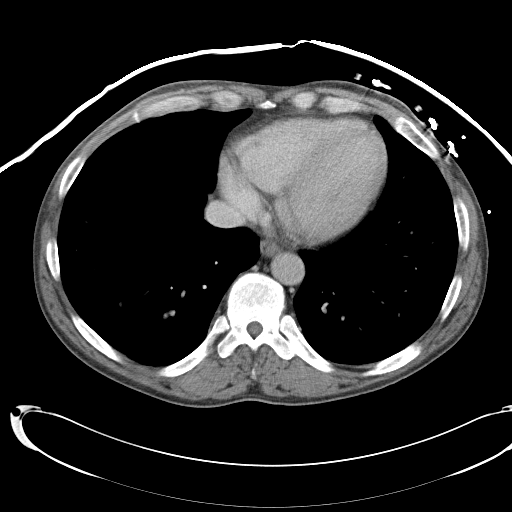
[im 83/88  lung]
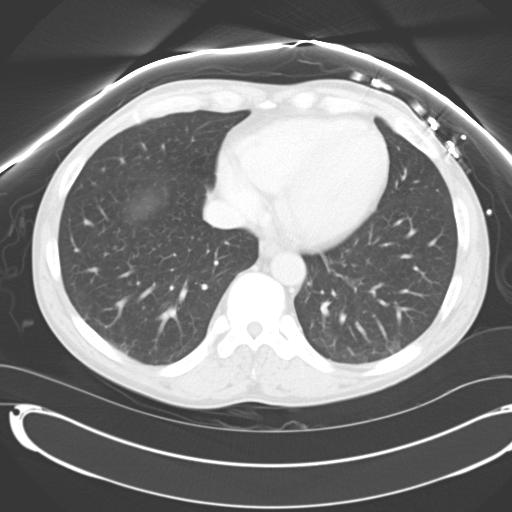

[14 of 32 positions shown; findings below may reference images not displayed]

FINDINGS: Minimal bibasilar atelectasis is noted.

The liver and spleen are unremarkable in appearance.  The
gallbladder is within normal limits.  The pancreas and adrenal
glands are unremarkable.  The kidneys are within normal limits
bilaterally; no hydronephrosis or perinephric stranding is seen.

No free fluid is identified.  The small bowel is unremarkable in
appearance.  The stomach is within normal limits; mild apparent
thickening along the gastric wall likely reflects intraluminal
contents.  No acute vascular abnormalities are seen.  Mild
scattered calcification is noted along the distal abdominal aorta
and its branches.

The appendix is normal in caliber and contains air, without
evidence for appendicitis.  The colon is unremarkable in
appearance.  The rectum and anorectal canal are grossly
unremarkable in appearance.  There is no evidence of abscess;
hemorrhoids or anal fissures would not be well characterized on CT.

The bladder is mildly distended and grossly unremarkable in
appearance.  The prostate is normal in size.  No inguinal
lymphadenopathy is seen.

No acute osseous abnormalities are identified.  Endplate
irregularity along the lumbar spine may reflect sequelae of
Scheuermann's disease.
IMPRESSION: 1.  No acute abnormalities identified within the abdomen or pelvis.
2.  Rectum and anorectal canal are grossly unremarkable in
appearance on CT.
3.  Mild scattered calcification along the distal abdominal aorta
and its branches.

## 2015-06-08 ENCOUNTER — Ambulatory Visit: Payer: Self-pay | Admitting: Internal Medicine

## 2015-06-08 ENCOUNTER — Emergency Department (HOSPITAL_COMMUNITY)
Admission: EM | Admit: 2015-06-08 | Discharge: 2015-06-08 | Disposition: A | Discharge status: Home / Self Care | Payer: Self-pay | Attending: Emergency Medicine | Admitting: Emergency Medicine

## 2015-06-08 ENCOUNTER — Telehealth: Payer: Self-pay

## 2015-06-08 ENCOUNTER — Encounter (HOSPITAL_COMMUNITY): Payer: Self-pay

## 2015-06-08 DIAGNOSIS — F17200 Nicotine dependence, unspecified, uncomplicated: Secondary | ICD-10-CM | POA: Insufficient documentation

## 2015-06-08 DIAGNOSIS — R202 Paresthesia of skin: Secondary | ICD-10-CM | POA: Insufficient documentation

## 2015-06-08 DIAGNOSIS — Z9889 Other specified postprocedural states: Secondary | ICD-10-CM | POA: Insufficient documentation

## 2015-06-08 DIAGNOSIS — M25511 Pain in right shoulder: Secondary | ICD-10-CM | POA: Insufficient documentation

## 2015-06-08 DIAGNOSIS — M79609 Pain in unspecified limb: Secondary | ICD-10-CM

## 2015-06-08 LAB — URINALYSIS, ROUTINE W REFLEX MICROSCOPIC
Bilirubin Urine: NEGATIVE
Glucose, UA: NEGATIVE mg/dL
Hgb urine dipstick: NEGATIVE
Ketones, ur: NEGATIVE mg/dL
Leukocytes, UA: NEGATIVE
Nitrite: NEGATIVE
Protein, ur: NEGATIVE mg/dL
Specific Gravity, Urine: 1.009 (ref 1.005–1.030)
pH: 5.5 (ref 5.0–8.0)

## 2015-06-08 LAB — COMPREHENSIVE METABOLIC PANEL
ALT: 89 U/L — ABNORMAL HIGH (ref 17–63)
AST: 102 U/L — ABNORMAL HIGH (ref 15–41)
Albumin: 4 g/dL (ref 3.5–5.0)
Alkaline Phosphatase: 69 U/L (ref 38–126)
Anion gap: 12 (ref 5–15)
BUN: 7 mg/dL (ref 6–20)
CO2: 21 mmol/L — ABNORMAL LOW (ref 22–32)
Calcium: 8.6 mg/dL — ABNORMAL LOW (ref 8.9–10.3)
Chloride: 106 mmol/L (ref 101–111)
Creatinine, Ser: 0.75 mg/dL (ref 0.61–1.24)
GFR calc Af Amer: >60 mL/min (ref >60)
GFR calc non Af Amer: >60 mL/min (ref >60)
Glucose, Bld: 87 mg/dL (ref 65–99)
Potassium: 4.6 mmol/L (ref 3.5–5.1)
Sodium: 139 mmol/L (ref 135–145)
Total Bilirubin: 0.9 mg/dL (ref 0.3–1.2)
Total Protein: 7.3 g/dL (ref 6.5–8.1)

## 2015-06-08 LAB — CBC WITH DIFFERENTIAL/PLATELET
Basophils Absolute: 0 10*3/uL (ref 0.0–0.1)
Basophils Relative: 1 %
Eosinophils Absolute: 0.1 10*3/uL (ref 0.0–0.7)
Eosinophils Relative: 1 %
HCT: 39.6 % (ref 39.0–52.0)
Hemoglobin: 13.4 g/dL (ref 13.0–17.0)
Lymphocytes Relative: 24 %
Lymphs Abs: 1 10*3/uL (ref 0.7–4.0)
MCH: 30.7 pg (ref 26.0–34.0)
MCHC: 33.8 g/dL (ref 30.0–36.0)
MCV: 90.6 fL (ref 78.0–100.0)
Monocytes Absolute: 0.3 10*3/uL (ref 0.1–1.0)
Monocytes Relative: 7 %
Neutro Abs: 2.7 10*3/uL (ref 1.7–7.7)
Neutrophils Relative %: 67 %
Platelets: 139 10*3/uL — ABNORMAL LOW (ref 150–400)
RBC: 4.37 MIL/uL (ref 4.22–5.81)
RDW: 13.9 % (ref 11.5–15.5)
WBC: 4.1 10*3/uL (ref 4.0–10.5)

## 2015-06-08 MED ORDER — TRAMADOL HCL 50 MG PO TABS: 50.0000 mg | ORAL_TABLET | Freq: Four times a day (QID) | ORAL | Status: AC | PRN

## 2015-06-08 MED ORDER — PREDNISONE 50 MG PO TABS: 50.0000 mg | ORAL_TABLET | Freq: Every day | ORAL | Status: DC

## 2015-06-08 NOTE — Discharge Instructions (Signed)
Return here as needed.  Follow-up with the resources provided. °

## 2015-06-08 NOTE — ED Notes (Signed)
Pt presents with c/o right shoulder pain and numbness. Pt reports no recent injury to his right shoulder or right leg, only an injury to both that occurred approx 15 years ago. Pt reports his right shoulder is painful and intermittently numb. Pt also reports that his right leg started cramping approx 3 days ago and yesterday he reported that the leg feels "cold" and is throbbing. Ambulatory to triage.

## 2015-06-08 NOTE — Progress Notes (Addendum)
[facility] to assist patient with pcp and follow up.  EDCM spoke to patient at bedside. Patient confirms he does not have a pcp or [place].  [facility], informed patient of services there and walk in times.  EDCM also provided patient with list of pcps who accept self pay patients, list of discount pharmacies and websites needymeds.org and GoodRX.com for medication assistance, phone number to inquire about the orange card, phone number to inquire about Medicaid, phone number to inquire about the Affordable Care Act, financial resources in the community such as local churches, [employer], urban ministries, and dental assistance for uninsured patients.  Patient thankful for resources.  No further EDCM needs at this time.  ED[facility] and [facility].  EDCM provided patient with address and phone number [facility].  EDCM instructed patient to bring 10 dollars cash for copay and any medications that he is taking with him to the appointment.  EDCM instructed patient to call 24 hours in advance if he needs to call or reschedule.  EDCM provided patient with coupon from goodrx for prednisone prescription to assist with cost.  Patient thankful for assistance.  No further EDCM needs at this time. [facility] appointment placed on AVS.

## 2015-06-08 NOTE — ED Provider Notes (Signed)
CSN: [id number]     Arrival date & time 06/08/15  1043 History   First MD Initiated Contact with Patient 06/08/15 1151     Chief Complaint  Patient presents with  . Shoulder Pain  . Leg Pain     (Consider location/radiation/quality/duration/timing/severity/associated sxs/prior Treatment) HPI Patient presents to the emergency department with right shoulder pain with numbness and right leg pain that has been ongoing for several years, but got worse over the last few months.  Patient states that he feels like his leg is colder than normal and feels tingling at times.  Patient denies chest pain, shortness breath, weakness, dizziness, headache, blurred vision, back pain, neck pain, fever, bloody stool, hematemesis, or syncope.  Patient states he did not take any medications prior to arrival.  He states movement and palpation make the discomfort worse History reviewed. No pertinent past medical history. Past Surgical History  Procedure Laterality Date  . Leg surgery     No family history on file. Social History  Substance Use Topics  . Smoking status: Current Every Day Smoker  . Smokeless tobacco: None  . Alcohol Use: Yes     Comment: occasional     Review of Systems  All other systems negative except as documented in the HPI. All pertinent positives and negatives as reviewed in the HPI.   Allergies  Review of patient's allergies indicates no known allergies.  Home Medications   Prior to Admission medications   Not on File   BP 120/81 mmHg  Pulse 67  Temp(Src) 97.9 F (36.6 C) (Oral)  Resp 20  SpO2 95% Physical Exam  Constitutional: He is oriented to person, place, and time. He appears well-developed and well-nourished. No distress.  HENT:  Head: Normocephalic and atraumatic.  Mouth/Throat: Oropharynx is clear and moist.  Eyes: Pupils are equal, round, and reactive to light.  Neck: Normal range of motion. Neck supple.  Cardiovascular: Normal rate, regular rhythm and  normal heart sounds.  Exam reveals no gallop and no friction rub.   No murmur heard. Pulmonary/Chest: Effort normal and breath sounds normal. No respiratory distress. He has no wheezes.  Abdominal: Soft. Bowel sounds are normal. He exhibits no distension. There is no tenderness.  Musculoskeletal:       Right shoulder: He exhibits tenderness and pain. He exhibits normal range of motion, no bony tenderness, no swelling, no effusion, no deformity, no laceration, no spasm and normal pulse.  Neurological: He is alert and oriented to person, place, and time. He has normal strength and normal reflexes. He displays normal reflexes. No sensory deficit. He exhibits normal muscle tone. Coordination and gait normal. GCS eye subscore is 4. GCS verbal subscore is 5. GCS motor subscore is 6.  Skin: Skin is warm and dry. No rash noted. No erythema.  Psychiatric: He has a normal mood and affect. His behavior is normal.  Nursing note and vitals reviewed.   ED Course  Procedures (including critical care time) Labs Review Labs Reviewed  COMPREHENSIVE METABOLIC PANEL - Abnormal; Notable for the following:    CO2 21 (*)    Calcium 8.6 (*)    AST 102 (*)    ALT 89 (*)    All other components within normal limits  CBC WITH DIFFERENTIAL/PLATELET - Abnormal; Notable for the following:    Platelets 139 (*)    All other components within normal limits  URINALYSIS, ROUTINE W REFLEX MICROSCOPIC (NOT AT [facility]) - Abnormal; Notable for the following:    APPearance  CLOUDY (*)    All other components within normal limits    Imaging Review No results found. I have personally reviewed and evaluated these images and lab results as part of my medical decision-making.   EKG Interpretation   Date/Time:  Wednesday June 08 2015 12:42:02 EST Ventricular Rate:  72 PR Interval:  144 QRS Duration: 91 QT Interval:  382 QTC Calculation: 418 R Axis:   81 Text Interpretation:  Sinus rhythm Consider left atrial  enlargement  Minimal ST elevation, anterior leads No significant change since last  tracing Confirmed by FLOYD MD, Reuel BoomANIEL ([id number]) on 06/08/2015 12:44:53 PM      MDM   Final diagnoses:  None      Return here as needed.  Follow-up with the clinic provided.  This is a chronic problem that will need further evaluation and neurology.  Charlestine Nighthristopher Braxtyn Bojarski, PA-C 06/09/15 1725  Melene Planan Floyd, DO 06/09/15 1820

## 2015-06-08 NOTE — Telephone Encounter (Signed)
Message received from Radford PaxAmy Ferrero, RN CM requesting an appointment for the patient to establish care.  As per Idelle Leechhadelle Brown, [facility] , there are no appointments available at this time for new patients.  Update provided to A. Bennie DallasFerrero, RN CM.

## 2015-06-20 ENCOUNTER — Ambulatory Visit (INDEPENDENT_AMBULATORY_CARE_PROVIDER_SITE_OTHER): Payer: Self-pay | Admitting: Internal Medicine

## 2015-06-20 ENCOUNTER — Encounter: Payer: Self-pay | Admitting: Internal Medicine

## 2015-06-20 VITALS — BP 160/92 | HR 66 | Resp 18 | Ht 66.0 in | Wt 159.0 lb

## 2015-06-20 DIAGNOSIS — M545 Low back pain, unspecified: Secondary | ICD-10-CM

## 2015-06-20 DIAGNOSIS — M19049 Primary osteoarthritis, unspecified hand: Secondary | ICD-10-CM

## 2015-06-20 DIAGNOSIS — M1990 Unspecified osteoarthritis, unspecified site: Secondary | ICD-10-CM

## 2015-06-20 DIAGNOSIS — G8929 Other chronic pain: Secondary | ICD-10-CM

## 2015-06-20 DIAGNOSIS — IMO0001 Reserved for inherently not codable concepts without codable children: Secondary | ICD-10-CM

## 2015-06-20 DIAGNOSIS — R748 Abnormal levels of other serum enzymes: Secondary | ICD-10-CM | POA: Insufficient documentation

## 2015-06-20 DIAGNOSIS — R200 Anesthesia of skin: Secondary | ICD-10-CM | POA: Insufficient documentation

## 2015-06-20 DIAGNOSIS — F32A Depression, unspecified: Secondary | ICD-10-CM

## 2015-06-20 DIAGNOSIS — R202 Paresthesia of skin: Secondary | ICD-10-CM

## 2015-06-20 DIAGNOSIS — F329 Major depressive disorder, single episode, unspecified: Secondary | ICD-10-CM

## 2015-06-20 DIAGNOSIS — R030 Elevated blood-pressure reading, without diagnosis of hypertension: Secondary | ICD-10-CM

## 2015-06-20 DIAGNOSIS — M79604 Pain in right leg: Secondary | ICD-10-CM

## 2015-06-20 MED ORDER — CYCLOBENZAPRINE HCL 5 MG PO TABS: ORAL_TABLET | ORAL | Status: AC

## 2015-06-20 MED ORDER — CYCLOBENZAPRINE HCL 5 MG PO TABS: ORAL_TABLET | ORAL | Status: DC

## 2015-06-20 NOTE — Patient Instructions (Signed)
Tylenol 500 mg 2 tabs twice daily with food. Use the muscle relaxant for when you are having more severe back pain
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
